# Patient Record
Sex: Male | Born: 1950 | Race: White | Hispanic: No | Marital: Married | Smoking: Former smoker
Health system: Southern US, Community
[De-identification: ages and names within clinical notes are randomized; demographics above are authoritative.]

## PROBLEM LIST (undated history)

## (undated) DIAGNOSIS — I10 Essential (primary) hypertension: Secondary | ICD-10-CM

## (undated) DIAGNOSIS — E785 Hyperlipidemia, unspecified: Secondary | ICD-10-CM

---

## 2008-09-30 ENCOUNTER — Ambulatory Visit: Payer: Self-pay | Admitting: Family Medicine

## 2008-09-30 DIAGNOSIS — S61209A Unspecified open wound of unspecified finger without damage to nail, initial encounter: Secondary | ICD-10-CM | POA: Insufficient documentation

## 2008-10-09 ENCOUNTER — Ambulatory Visit: Payer: Self-pay | Admitting: Occupational Medicine

## 2009-12-11 HISTORY — PX: KNEE RECONSTRUCTION: SHX5883

## 2015-12-12 HISTORY — PX: TOTAL HIP ARTHROPLASTY: SHX124

## 2017-07-31 ENCOUNTER — Telehealth (INDEPENDENT_AMBULATORY_CARE_PROVIDER_SITE_OTHER): Payer: Self-pay | Admitting: Radiology

## 2017-09-07 NOTE — Telephone Encounter (Signed)
error 

## 2018-06-10 HISTORY — PX: TENDON REPAIR: SHX5111

## 2018-11-15 ENCOUNTER — Emergency Department (HOSPITAL_COMMUNITY)
Admission: EM | Admit: 2018-11-15 | Discharge: 2018-11-15 | Disposition: A | Discharge status: Home / Self Care | Payer: BLUE CROSS/BLUE SHIELD | Attending: Emergency Medicine | Admitting: Emergency Medicine

## 2018-11-15 ENCOUNTER — Emergency Department (HOSPITAL_COMMUNITY): Payer: BLUE CROSS/BLUE SHIELD

## 2018-11-15 ENCOUNTER — Encounter (HOSPITAL_COMMUNITY): Payer: Self-pay

## 2018-11-15 DIAGNOSIS — Y999 Unspecified external cause status: Secondary | ICD-10-CM | POA: Insufficient documentation

## 2018-11-15 DIAGNOSIS — Y9389 Activity, other specified: Secondary | ICD-10-CM | POA: Diagnosis not present

## 2018-11-15 DIAGNOSIS — S29012A Strain of muscle and tendon of back wall of thorax, initial encounter: Secondary | ICD-10-CM | POA: Diagnosis not present

## 2018-11-15 DIAGNOSIS — Y92410 Unspecified street and highway as the place of occurrence of the external cause: Secondary | ICD-10-CM | POA: Diagnosis not present

## 2018-11-15 DIAGNOSIS — Z87891 Personal history of nicotine dependence: Secondary | ICD-10-CM | POA: Diagnosis not present

## 2018-11-15 DIAGNOSIS — I10 Essential (primary) hypertension: Secondary | ICD-10-CM | POA: Diagnosis not present

## 2018-11-15 DIAGNOSIS — Z96642 Presence of left artificial hip joint: Secondary | ICD-10-CM | POA: Diagnosis not present

## 2018-11-15 DIAGNOSIS — S299XXA Unspecified injury of thorax, initial encounter: Secondary | ICD-10-CM | POA: Diagnosis present

## 2018-11-15 HISTORY — DX: Hyperlipidemia, unspecified: E78.5

## 2018-11-15 HISTORY — DX: Essential (primary) hypertension: I10

## 2018-11-15 MED ORDER — METHOCARBAMOL 500 MG PO TABS: 1000.0000 mg | ORAL_TABLET | Freq: Three times a day (TID) | ORAL | 0 refills | Status: AC | PRN

## 2018-11-15 MED ORDER — ACETAMINOPHEN 325 MG PO TABS
650.0000 mg | ORAL_TABLET | ORAL | Status: DC | PRN
  Administered 2018-11-15: 650 mg via ORAL
  Filled 2018-11-15: qty 2

## 2018-11-15 MED ORDER — METHOCARBAMOL 500 MG PO TABS
500.0000 mg | ORAL_TABLET | Freq: Once | ORAL | Status: AC
  Administered 2018-11-15: 500 mg via ORAL
  Filled 2018-11-15: qty 1
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
